# Patient Record
Sex: Female | Born: 1950
Health system: Southern US, Community
[De-identification: ages and names within clinical notes are randomized; demographics above are authoritative.]

## PROBLEM LIST (undated history)

## (undated) DIAGNOSIS — I10 Essential (primary) hypertension: Secondary | ICD-10-CM

## (undated) HISTORY — DX: Essential (primary) hypertension: I10

---

## 2012-07-14 ENCOUNTER — Ambulatory Visit: Payer: Self-pay | Admitting: Family Medicine

## 2012-09-29 ENCOUNTER — Encounter: Payer: Self-pay | Admitting: Family Medicine

## 2012-09-29 ENCOUNTER — Ambulatory Visit (INDEPENDENT_AMBULATORY_CARE_PROVIDER_SITE_OTHER): Payer: BC Managed Care – PPO | Admitting: Family Medicine

## 2012-09-29 VITALS — BP 134/86 | HR 73 | Temp 96.9°F | Ht 63.5 in | Wt 121.2 lb

## 2012-09-29 DIAGNOSIS — J209 Acute bronchitis, unspecified: Secondary | ICD-10-CM

## 2012-09-29 MED ORDER — HYDROCODONE-HOMATROPINE 5-1.5 MG/5ML PO SYRP: 5.0000 mL | ORAL_SOLUTION | Freq: Three times a day (TID) | ORAL | Status: DC | PRN

## 2012-09-29 MED ORDER — AZITHROMYCIN 250 MG PO TABS: ORAL_TABLET | ORAL | Status: DC

## 2012-09-29 MED ORDER — METHYLPREDNISOLONE (PAK) 4 MG PO TABS: ORAL_TABLET | ORAL | Status: DC

## 2012-09-29 NOTE — Patient Instructions (Signed)

## 2012-09-29 NOTE — Progress Notes (Signed)
  Subjective:    Patient ID: Gina Mendoza, female    DOB: September 10, 1950, 62 y.o.   MRN: [id number]  HPI This 62 y.o. female presents for evaluation of cough and bronchitis sx's And she is a smoker.   Review of Systems C/o cough and congestion. No chest pain, SOB, HA, dizziness, vision change, N/V, diarrhea, constipation, dysuria, urinary urgency or frequency, myalgias, arthralgias or rash.     Objective:   Physical Exam Vital signs noted  Well developed well nourished female.  HEENT - Head atraumatic Normocephalic                Eyes - PERRLA, Conjuctiva - clear Sclera- Clear EOMI                Ears - EAC's Wnl TM's Wnl Gross Hearing WNL                Nose - Nares patent                 Throat - oropharanx wnl Respiratory - Lungs with exp wheezes bilateral Cardiac - RRR S1 and S2 without murmur        Assessment & Plan:  Acute bronchitis - Plan: methylPREDNIsolone (MEDROL DOSPACK) 4 MG tablet, azithromycin (ZITHROMAX) 250 MG tablet, HYDROcodone-homatropine (HYCODAN) 5-1.5 MG/5ML syrup Push po fluids, rest, and follow up prn, discussed quitting smoking.  Deatra Canter FNP

## 2012-11-07 ENCOUNTER — Ambulatory Visit (INDEPENDENT_AMBULATORY_CARE_PROVIDER_SITE_OTHER): Payer: BC Managed Care – PPO | Admitting: General Practice

## 2012-11-07 ENCOUNTER — Ambulatory Visit (INDEPENDENT_AMBULATORY_CARE_PROVIDER_SITE_OTHER): Payer: BC Managed Care – PPO

## 2012-11-07 ENCOUNTER — Encounter: Payer: Self-pay | Admitting: General Practice

## 2012-11-07 VITALS — BP 176/97 | HR 75 | Temp 96.5°F | Ht 63.5 in | Wt 119.0 lb

## 2012-11-07 DIAGNOSIS — I10 Essential (primary) hypertension: Secondary | ICD-10-CM

## 2012-11-07 DIAGNOSIS — Z833 Family history of diabetes mellitus: Secondary | ICD-10-CM

## 2012-11-07 DIAGNOSIS — R062 Wheezing: Secondary | ICD-10-CM

## 2012-11-07 DIAGNOSIS — Z0000 Encounter for general adult medical examination without abnormal findings: Secondary | ICD-10-CM

## 2012-11-07 DIAGNOSIS — R05 Cough: Secondary | ICD-10-CM

## 2012-11-07 DIAGNOSIS — R059 Cough, unspecified: Secondary | ICD-10-CM

## 2012-11-07 LAB — POCT CBC
HCT, POC: 39.2 % (ref 37.7–47.9)
MCHC: 34.4 g/dL (ref 31.8–35.4)
MCV: 91.8 fL (ref 80–97)
MPV: 7.4 fL (ref 0–99.8)
POC LYMPH PERCENT: 17.3 %L (ref 10–50)
RBC: 4.3 M/uL (ref 4.04–5.48)
RDW, POC: 13.8 %
WBC: 10.4 10*3/uL — AB (ref 4.6–10.2)

## 2012-11-07 MED ORDER — BENZONATATE 100 MG PO CAPS: 100.0000 mg | ORAL_CAPSULE | Freq: Two times a day (BID) | ORAL | Status: DC | PRN

## 2012-11-07 MED ORDER — ALBUTEROL SULFATE HFA 108 (90 BASE) MCG/ACT IN AERS: 2.0000 | INHALATION_SPRAY | Freq: Four times a day (QID) | RESPIRATORY_TRACT | Status: AC | PRN

## 2012-11-07 MED ORDER — HYDROCHLOROTHIAZIDE 25 MG PO TABS: 25.0000 mg | ORAL_TABLET | Freq: Every day | ORAL | Status: DC

## 2012-11-07 NOTE — Progress Notes (Signed)
  Subjective:    Patient ID: Gina Mendoza, female    DOB: 05-28-1950, 62 y.o.   MRN: [id number]  HPI Patient presents today for annual exam. She reports being seen in September for bronchitis and still has a dry cough that is worse at night. She denies post nasal drainage. Denies being seen by a provider in over ten years outside of visit here in September. Reports smoking 1ppd of cigarettes.  Reports checking blood pressures at home daily, ranged 140-160's/80-94. She reports beginning to eat a healthier diet. Reports checking blood sugar two weeks and received a reading of 200. Reports a family history of diabetes.     Review of Systems  Constitutional: Negative for fever and chills.  HENT: Negative for congestion, postnasal drip, sinus pressure, sneezing and sore throat.   Respiratory: Positive for wheezing. Negative for chest tightness and shortness of breath.   Cardiovascular: Negative for chest pain and palpitations.  Gastrointestinal: Negative for nausea, vomiting, abdominal pain, diarrhea, constipation and blood in stool.  Genitourinary: Negative for dysuria, hematuria and difficulty urinating.  Musculoskeletal: Negative for back pain.  Neurological: Negative for dizziness, weakness and headaches.       Objective:   Physical Exam  Constitutional: She is oriented to person, place, and time. She appears well-developed and well-nourished.  HENT:  Head: Normocephalic and atraumatic.  Right Ear: External ear normal.  Left Ear: External ear normal.  Mouth/Throat: Oropharynx is clear and moist.  Eyes: EOM are normal. Pupils are equal, round, and reactive to light.  Neck: Normal range of motion. Neck supple. No thyromegaly present.  Cardiovascular: Normal rate, regular rhythm and normal heart sounds.   Pulmonary/Chest: Effort normal. No respiratory distress. She has wheezes in the right upper field and the left upper field. She exhibits no tenderness.  Abdominal: Soft. Bowel sounds  are normal. She exhibits no distension. There is no tenderness.  Lymphadenopathy:    She has no cervical adenopathy.  Neurological: She is alert and oriented to person, place, and time.  Skin: Skin is warm and dry.  Psychiatric: She has a normal mood and affect.   [facility] reading (PRIMARY) by Coralie Keens, FNP-C, no acute process.                                        Assessment & Plan:  1. Cough  - DG Chest 2 View; Future -tessalon 100mg , 1 capsule by mouth twice daily for cough, # 20, no refills  2. Annual physical exam  - POCT CBC - CMP14+EGFR - NMR, lipoprofile  3. Family history of diabetes mellitus  - POCT glycosylated hemoglobin (Hb A1C) - POCT glucose (manual entry)  4. Wheezing  - albuterol (PROVENTIL HFA;VENTOLIN HFA) 108 (90 BASE) MCG/ACT inhaler; Inhale 2 puffs into the lungs every 6 (six) hours as needed for wheezing or shortness of breath.  Dispense: 1 Inhaler; Refill: 3  5. Hypertension - hydrochlorothiazide (HYDRODIURIL) 25 MG tablet; Take 1 tablet (25 mg total) by mouth daily.  Dispense: 30 tablet; Refill: 0 -discussed healthy lifestyle changes ( low sodium, low fat, baked foods, regular exercise) -RTO in one week for follow up and sooner if symptoms worsen Patient verbalized understanding Coralie Keens, FNP-C

## 2012-11-07 NOTE — Patient Instructions (Signed)
Smoking Cessation Quitting smoking is important to your health and has many advantages. However, it is not always easy to quit since nicotine is a very addictive drug. Often times, people try 3 times or more before being able to quit. This document explains the best ways for you to prepare to quit smoking. Quitting takes hard work and a lot of effort, but you can do it. ADVANTAGES OF QUITTING SMOKING  You will live longer, feel better, and live better.  Your body will feel the impact of quitting smoking almost immediately.  Within 20 minutes, blood pressure decreases. Your pulse returns to its normal level.  After 8 hours, carbon monoxide levels in the blood return to normal. Your oxygen level increases.  After 24 hours, the chance of having a heart attack starts to decrease. Your breath, hair, and body stop smelling like smoke.  After 48 hours, damaged nerve endings begin to recover. Your sense of taste and smell improve.  After 72 hours, the body is virtually free of nicotine. Your bronchial tubes relax and breathing becomes easier.  After 2 to 12 weeks, lungs can hold more air. Exercise becomes easier and circulation improves.  The risk of having a heart attack, stroke, cancer, or lung disease is greatly reduced.  After 1 year, the risk of coronary heart disease is cut in half.  After 5 years, the risk of stroke falls to the same as a nonsmoker.  After 10 years, the risk of lung cancer is cut in half and the risk of other cancers decreases significantly.  After 15 years, the risk of coronary heart disease drops, usually to the level of a nonsmoker.  If you are pregnant, quitting smoking will improve your chances of having a healthy baby.  The people you live with, especially any children, will be healthier.  You will have extra money to spend on things other than cigarettes. QUESTIONS TO THINK ABOUT BEFORE ATTEMPTING TO QUIT You may want to talk about your answers with your  caregiver.  Why do you want to quit?  If you tried to quit in the past, what helped and what did not?  What will be the most difficult situations for you after you quit? How will you plan to handle them?  Who can help you through the tough times? Your family? Friends? A caregiver?  What pleasures do you get from smoking? What ways can you still get pleasure if you quit? Here are some questions to ask your caregiver:  How can you help me to be successful at quitting?  What medicine do you think would be best for me and how should I take it?  What should I do if I need more help?  What is smoking withdrawal like? How can I get information on withdrawal? GET READY  Set a quit date.  Change your environment by getting rid of all cigarettes, ashtrays, matches, and lighters in your home, car, or work. Do not let people smoke in your home.  Review your past attempts to quit. Think about what worked and what did not. GET SUPPORT AND ENCOURAGEMENT You have a better chance of being successful if you have help. You can get support in many ways.  Tell your family, friends, and co-workers that you are going to quit and need their support. Ask them not to smoke around you.  Get individual, group, or telephone counseling and support. Programs are available at local hospitals and health centers. Call your local health department for   information about programs in your area.  Spiritual beliefs and practices may help some smokers quit.  Download a "quit meter" on your computer to keep track of quit statistics, such as how long you have gone without smoking, cigarettes not smoked, and money saved.  Get a self-help book about quitting smoking and staying off of tobacco. LEARN NEW SKILLS AND BEHAVIORS  Distract yourself from urges to smoke. Talk to someone, go for a walk, or occupy your time with a task.  Change your normal routine. Take a different route to work. Drink tea instead of coffee.  Eat breakfast in a different place.  Reduce your stress. Take a hot bath, exercise, or read a book.  Plan something enjoyable to do every day. Reward yourself for not smoking.  Explore interactive web-based programs that specialize in helping you quit. GET MEDICINE AND USE IT CORRECTLY Medicines can help you stop smoking and decrease the urge to smoke. Combining medicine with the above behavioral methods and support can greatly increase your chances of successfully quitting smoking.  Nicotine replacement therapy helps deliver nicotine to your body without the negative effects and risks of smoking. Nicotine replacement therapy includes nicotine gum, lozenges, inhalers, nasal sprays, and skin patches. Some may be available over-the-counter and others require a prescription.  Antidepressant medicine helps people abstain from smoking, but how this works is unknown. This medicine is available by prescription.  Nicotinic receptor partial agonist medicine simulates the effect of nicotine in your brain. This medicine is available by prescription. Ask your caregiver for advice about which medicines to use and how to use them based on your health history. Your caregiver will tell you what side effects to look out for if you choose to be on a medicine or therapy. Carefully read the information on the package. Do not use any other product containing nicotine while using a nicotine replacement product.  RELAPSE OR DIFFICULT SITUATIONS Most relapses occur within the first 3 months after quitting. Do not be discouraged if you start smoking again. Remember, most people try several times before finally quitting. You may have symptoms of withdrawal because your body is used to nicotine. You may crave cigarettes, be irritable, feel very hungry, cough often, get headaches, or have difficulty concentrating. The withdrawal symptoms are only temporary. They are strongest when you first quit, but they will go away within  10 14 days. To reduce the chances of relapse, try to:  Avoid drinking alcohol. Drinking lowers your chances of successfully quitting.  Reduce the amount of caffeine you consume. Once you quit smoking, the amount of caffeine in your body increases and can give you symptoms, such as a rapid heartbeat, sweating, and anxiety.  Avoid smokers because they can make you want to smoke.  Do not let weight gain distract you. Many smokers will gain weight when they quit, usually less than 10 pounds. Eat a healthy diet and stay active. You can always lose the weight gained after you quit.  Find ways to improve your mood other than smoking. FOR MORE INFORMATION  www.smokefree.gov  Document Released: 12/15/2000 Document Revised: 06/22/2011 Document Reviewed: 04/01/2011 [employer] Patient Information 2014 [employer].  

## 2012-11-09 ENCOUNTER — Other Ambulatory Visit: Payer: Self-pay | Admitting: General Practice

## 2012-11-09 LAB — CMP14+EGFR
ALT: 9 IU/L (ref 0–32)
AST: 17 IU/L (ref 0–40)
Albumin/Globulin Ratio: 1.9 (ref 1.1–2.5)
Alkaline Phosphatase: 97 IU/L (ref 39–117)
GFR calc Af Amer: 99 mL/min/{1.73_m2} (ref >59)
GFR calc non Af Amer: 86 mL/min/{1.73_m2} (ref >59)
Potassium: 4.2 mmol/L (ref 3.5–5.2)
Sodium: 143 mmol/L (ref 134–144)
Total Bilirubin: <0.2 mg/dL (ref 0.0–1.2)
Total Protein: 6.7 g/dL (ref 6.0–8.5)

## 2012-11-09 LAB — NMR, LIPOPROFILE
HDL Particle Number: 32.1 umol/L (ref >=30.5)
LDL Particle Number: 2155 nmol/L — ABNORMAL HIGH (ref <1000)
LDL Size: 20.7 nm (ref >20.5)
LP-IR Score: 47 — ABNORMAL HIGH (ref <=45)
Small LDL Particle Number: 1065 nmol/L — ABNORMAL HIGH (ref <=527)
Triglycerides by NMR: 162 mg/dL — ABNORMAL HIGH (ref <150)

## 2012-11-14 ENCOUNTER — Encounter: Payer: Self-pay | Admitting: General Practice

## 2012-11-14 ENCOUNTER — Ambulatory Visit (INDEPENDENT_AMBULATORY_CARE_PROVIDER_SITE_OTHER): Payer: BC Managed Care – PPO | Admitting: General Practice

## 2012-11-14 ENCOUNTER — Telehealth: Payer: Self-pay

## 2012-11-14 ENCOUNTER — Encounter (INDEPENDENT_AMBULATORY_CARE_PROVIDER_SITE_OTHER): Payer: Self-pay

## 2012-11-14 VITALS — BP 124/82 | HR 79 | Temp 97.4°F | Wt 116.0 lb

## 2012-11-14 DIAGNOSIS — R059 Cough, unspecified: Secondary | ICD-10-CM

## 2012-11-14 DIAGNOSIS — R05 Cough: Secondary | ICD-10-CM

## 2012-11-14 DIAGNOSIS — I10 Essential (primary) hypertension: Secondary | ICD-10-CM

## 2012-11-14 DIAGNOSIS — E785 Hyperlipidemia, unspecified: Secondary | ICD-10-CM

## 2012-11-14 MED ORDER — ATORVASTATIN CALCIUM 20 MG PO TABS: 20.0000 mg | ORAL_TABLET | Freq: Every day | ORAL | Status: AC

## 2012-11-14 MED ORDER — BENZONATATE 100 MG PO CAPS: 100.0000 mg | ORAL_CAPSULE | Freq: Two times a day (BID) | ORAL | Status: AC | PRN

## 2012-11-14 MED ORDER — HYDROCHLOROTHIAZIDE 25 MG PO TABS: 25.0000 mg | ORAL_TABLET | Freq: Every day | ORAL | Status: DC

## 2012-11-14 NOTE — Patient Instructions (Addendum)
Hypertriglyceridemia  Diet for High blood levels of Triglycerides Most fats in food are triglycerides. Triglycerides in your blood are stored as fat in your body. High levels of triglycerides in your blood may put you at a greater risk for heart disease and stroke.  Normal triglyceride levels are less than 150 mg/dL. Borderline high levels are 150-199 mg/dl. High levels are 200 - 499 mg/dL, and very high triglyceride levels are greater than 500 mg/dL. The decision to treat high triglycerides is generally based on the level. For people with borderline or high triglyceride levels, treatment includes weight loss and exercise. Drugs are recommended for people with very high triglyceride levels. Many people who need treatment for high triglyceride levels have metabolic syndrome. This syndrome is a collection of disorders that often include: insulin resistance, high blood pressure, blood clotting problems, high cholesterol and triglycerides. TESTING PROCEDURE FOR TRIGLYCERIDES  You should not eat 4 hours before getting your triglycerides measured. The normal range of triglycerides is between 10 and 250 milligrams per deciliter (mg/dl). Some people may have extreme levels (1000 or above), but your triglyceride level may be too high if it is above 150 mg/dl, depending on what other risk factors you have for heart disease.  People with high blood triglycerides may also have high blood cholesterol levels. If you have high blood cholesterol as well as high blood triglycerides, your risk for heart disease is probably greater than if you only had high triglycerides. High blood cholesterol is one of the main risk factors for heart disease. CHANGING YOUR DIET  Your weight can affect your blood triglyceride level. If you are more than 20% above your ideal body weight, you may be able to lower your blood triglycerides by losing weight. Eating less and exercising regularly is the best way to combat this. Fat provides more  calories than any other food. The best way to lose weight is to eat less fat. Only 30% of your total calories should come from fat. Less than 7% of your diet should come from saturated fat. A diet low in fat and saturated fat is the same as a diet to decrease blood cholesterol. By eating a diet lower in fat, you may lose weight, lower your blood cholesterol, and lower your blood triglyceride level.  Eating a diet low in fat, especially saturated fat, may also help you lower your blood triglyceride level. Ask your dietitian to help you figure how much fat you can eat based on the number of calories your caregiver has prescribed for you.  Exercise, in addition to helping with weight loss may also help lower triglyceride levels.   Alcohol can increase blood triglycerides. You may need to stop drinking alcoholic beverages.  Too much carbohydrate in your diet may also increase your blood triglycerides. Some complex carbohydrates are necessary in your diet. These may include bread, rice, potatoes, other starchy vegetables and cereals.  Reduce "simple" carbohydrates. These may include pure sugars, candy, honey, and jelly without losing other nutrients. If you have the kind of high blood triglycerides that is affected by the amount of carbohydrates in your diet, you will need to eat less sugar and less high-sugar foods. Your caregiver can help you with this.  Adding 2-4 grams of fish oil (EPA+ DHA) may also help lower triglycerides. Speak with your caregiver before adding any supplements to your regimen. Following the Diet  Maintain your ideal weight. Your caregivers can help you with a diet. Generally, eating less food and getting more   exercise will help you lose weight. Joining a weight control group may also help. Ask your caregivers for a good weight control group in your area.  Eat low-fat foods instead of high-fat foods. This can help you lose weight too.  These foods are lower in fat. Eat MORE of these:    Dried beans, peas, and lentils.  Egg whites.  Low-fat cottage cheese.  Fish.  Lean cuts of meat, such as round, sirloin, rump, and flank (cut extra fat off meat you fix).  Whole grain breads, cereals and pasta.  Skim and nonfat dry milk.  Low-fat yogurt.  Poultry without the skin.  Cheese made with skim or part-skim milk, such as mozzarella, parmesan, farmers', ricotta, or pot cheese. These are higher fat foods. Eat LESS of these:   Whole milk and foods made from whole milk, such as American, blue, cheddar, monterey jack, and swiss cheese  High-fat meats, such as luncheon meats, sausages, knockwurst, bratwurst, hot dogs, ribs, corned beef, ground pork, and regular ground beef.  Fried foods. Limit saturated fats in your diet. Substituting unsaturated fat for saturated fat may decrease your blood triglyceride level. You will need to read package labels to know which products contain saturated fats.  These foods are high in saturated fat. Eat LESS of these:   Fried pork skins.  Whole milk.  Skin and fat from poultry.  Palm oil.  Butter.  Shortening.  Cream cheese.  Bacon.  Margarines and baked goods made from listed oils.  Vegetable shortenings.  Chitterlings.  Fat from meats.  Coconut oil.  Palm kernel oil.  Lard.  Cream.  Sour cream.  Fatback.  Coffee whiteners and non-dairy creamers made with these oils.  Cheese made from whole milk. Use unsaturated fats (both polyunsaturated and monounsaturated) moderately. Remember, even though unsaturated fats are better than saturated fats; you still want a diet low in total fat.  These foods are high in unsaturated fat:   Canola oil.  Sunflower oil.  Mayonnaise.  Almonds.  Peanuts.  Pine nuts.  Margarines made with these oils.  Safflower oil.  Olive oil.  Avocados.  Cashews.  Peanut butter.  Sunflower seeds.  Soybean oil.  Peanut  oil.  Olives.  Pecans.  Walnuts.  Pumpkin seeds. Avoid sugar and other high-sugar foods. This will decrease carbohydrates without decreasing other nutrients. Sugar in your food goes rapidly to your blood. When there is excess sugar in your blood, your liver may use it to make more triglycerides. Sugar also contains calories without other important nutrients.  Eat LESS of these:   Sugar, brown sugar, powdered sugar, jam, jelly, preserves, honey, syrup, molasses, pies, candy, cakes, cookies, frosting, pastries, colas, soft drinks, punches, fruit drinks, and regular gelatin.  Avoid alcohol. Alcohol, even more than sugar, may increase blood triglycerides. In addition, alcohol is high in calories and low in nutrients. Ask for sparkling water, or a diet soft drink instead of an alcoholic beverage. Suggestions for planning and preparing meals   Bake, broil, grill or roast meats instead of frying.  Remove fat from meats and skin from poultry before cooking.  Add spices, herbs, lemon juice or vinegar to vegetables instead of salt, rich sauces or gravies.  Use a non-stick skillet without fat or use no-stick sprays.  Cool and refrigerate stews and broth. Then remove the hardened fat floating on the surface before serving.  Refrigerate meat drippings and skim off fat to make low-fat gravies.  Serve more fish.  Use less butter,   margarine and other high-fat spreads on bread or vegetables.  Use skim or reconstituted non-fat dry milk for cooking.  Cook with low-fat cheeses.  Substitute low-fat yogurt or cottage cheese for all or part of the sour cream in recipes for sauces, dips or congealed salads.  Use half yogurt/half mayonnaise in salad recipes.  Substitute evaporated skim milk for cream. Evaporated skim milk or reconstituted non-fat dry milk can be whipped and substituted for whipped cream in certain recipes.  Choose fresh fruits for dessert instead of high-fat foods such as pies or  cakes. Fruits are naturally low in fat. When Dining Out   Order low-fat appetizers such as fruit or vegetable juice, pasta with vegetables or tomato sauce.  Select clear, rather than cream soups.  Ask that dressings and gravies be served on the side. Then use less of them.  Order foods that are baked, broiled, poached, steamed, stir-fried, or roasted.  Ask for margarine instead of butter, and use only a small amount.  Drink sparkling water, unsweetened tea or coffee, or diet soft drinks instead of alcohol or other sweet beverages. QUESTIONS AND ANSWERS ABOUT OTHER FATS IN THE BLOOD: SATURATED FAT, TRANS FAT, AND CHOLESTEROL What is trans fat? Trans fat is a type of fat that is formed when vegetable oil is hardened through a process called hydrogenation. This process helps makes foods more solid, gives them shape, and prolongs their shelf life. Trans fats are also called hydrogenated or partially hydrogenated oils.  What do saturated fat, trans fat, and cholesterol in foods have to do with heart disease? Saturated fat, trans fat, and cholesterol in the diet all raise the level of LDL "bad" cholesterol in the blood. The higher the LDL cholesterol, the greater the risk for coronary heart disease (CHD). Saturated fat and trans fat raise LDL similarly.  What foods contain saturated fat, trans fat, and cholesterol? High amounts of saturated fat are found in animal products, such as fatty cuts of meat, chicken skin, and full-fat dairy products like butter, whole milk, cream, and cheese, and in tropical vegetable oils such as palm, palm kernel, and coconut oil. Trans fat is found in some of the same foods as saturated fat, such as vegetable shortening, some margarines (especially hard or stick margarine), crackers, cookies, baked goods, fried foods, salad dressings, and other processed foods made with partially hydrogenated vegetable oils. Small amounts of trans fat also occur naturally in some animal  products, such as milk products, beef, and lamb. Foods high in cholesterol include liver, other organ meats, egg yolks, shrimp, and full-fat dairy products. How can I use the new food label to make heart-healthy food choices? Check the Nutrition Facts panel of the food label. Choose foods lower in saturated fat, trans fat, and cholesterol. For saturated fat and cholesterol, you can also use the Percent Daily Value (%DV): 5% DV or less is low, and 20% DV or more is high. (There is no %DV for trans fat.) Use the Nutrition Facts panel to choose foods low in saturated fat and cholesterol, and if the trans fat is not listed, read the ingredients and limit products that list shortening or hydrogenated or partially hydrogenated vegetable oil, which tend to be high in trans fat. POINTS TO REMEMBER:   Discuss your risk for heart disease with your caregivers, and take steps to reduce risk factors.  Change your diet. Choose foods that are low in saturated fat, trans fat, and cholesterol.  Add exercise to your daily routine if   it is not already being done. Participate in physical activity of moderate intensity, like brisk walking, for at least 30 minutes on most, and preferably all days of the week. No time? Break the 30 minutes into three, 10-minute segments during the day.  Stop smoking. If you do smoke, contact your caregiver to discuss ways in which they can help you quit.  Do not use street drugs.  Maintain a normal weight.  Maintain a healthy blood pressure.  Keep up with your blood work for checking the fats in your blood as directed by your caregiver. Document Released: 10/09/2003 Document Revised: 06/22/2011 Document Reviewed: 05/06/2008 [employer] Patient Information 2014 [employer].  

## 2012-11-14 NOTE — Telephone Encounter (Signed)
Patient says you were suppose to  Order colonscopy

## 2012-11-15 NOTE — Progress Notes (Signed)
  Subjective:    Patient ID: Gina Mendoza, female    DOB: [birth year], 62 y.o.   MRN: [id number]  HPI Patient presents for follow up of hypertension. She reports checking blood pressure outside of this office and readings range 120-130's/80's. She reports taking medications as directed and eating healthier. Denies regular exercise. Discussed labs and hyperlipidemia.  Patient reports having a cough, but tessalon wasn't called into pharmacy.    Review of Systems  Respiratory: Negative for chest tightness and shortness of breath.   Cardiovascular: Negative for chest pain and palpitations.       Objective:   Physical Exam  Constitutional: She is oriented to person, place, and time. She appears well-developed and well-nourished.  Cardiovascular: Normal rate, regular rhythm and normal heart sounds.   Pulmonary/Chest: Effort normal and breath sounds normal. No respiratory distress. She exhibits no tenderness.  Neurological: She is alert and oriented to person, place, and time.  Skin: Skin is warm and dry.  Psychiatric: She has a normal mood and affect.          Assessment & Plan:  1. Cough  - benzonatate (TESSALON) 100 MG capsule; Take 1 capsule (100 mg total) by mouth 2 (two) times daily as needed for cough.  Dispense: 20 capsule; Refill: 0  2. Hypertension  - hydrochlorothiazide (HYDRODIURIL) 25 MG tablet; Take 1 tablet (25 mg total) by mouth daily.  Dispense: 30 tablet; Refill: 3  3. Hyperlipidemia  - atorvastatin (LIPITOR) 20 MG tablet; Take 1 tablet (20 mg total) by mouth daily.  Dispense: 30 tablet; Refill: 3 -discussed healthy eating (low fat, baked foods) -RTO in 3 months for follow up -Patient verbalized understanding Coralie Keens, FNP-C

## 2012-11-15 NOTE — Telephone Encounter (Signed)
Was areferral done for colonoscopy?

## 2012-11-15 NOTE — Telephone Encounter (Signed)
Referral made, she will be contacted with appointment time

## 2012-11-16 ENCOUNTER — Telehealth: Payer: Self-pay | Admitting: General Practice

## 2012-11-20 ENCOUNTER — Encounter: Payer: Self-pay | Admitting: Internal Medicine

## 2012-12-04 ENCOUNTER — Ambulatory Visit (AMBULATORY_SURGERY_CENTER): Payer: Self-pay | Admitting: *Deleted

## 2012-12-04 VITALS — Ht 64.0 in | Wt 117.4 lb

## 2012-12-04 DIAGNOSIS — Z1211 Encounter for screening for malignant neoplasm of colon: Secondary | ICD-10-CM

## 2012-12-04 MED ORDER — MOVIPREP 100 G PO SOLR: 1.0000 | Freq: Once | ORAL | Status: DC

## 2012-12-04 NOTE — Progress Notes (Signed)
Denies allergies to eggs or soy products. Denies complications with sedation or anesthesia. 

## 2012-12-05 ENCOUNTER — Encounter: Payer: Self-pay | Admitting: Internal Medicine

## 2012-12-14 ENCOUNTER — Encounter: Payer: Self-pay | Admitting: Internal Medicine

## 2012-12-14 ENCOUNTER — Ambulatory Visit (AMBULATORY_SURGERY_CENTER): Payer: BC Managed Care – PPO | Admitting: Internal Medicine

## 2012-12-14 VITALS — BP 120/74 | HR 67 | Temp 98.1°F | Resp 17 | Ht 64.0 in | Wt 117.0 lb

## 2012-12-14 DIAGNOSIS — Z1211 Encounter for screening for malignant neoplasm of colon: Secondary | ICD-10-CM

## 2012-12-14 DIAGNOSIS — Z800 Family history of malignant neoplasm of digestive organs: Secondary | ICD-10-CM

## 2012-12-14 MED ORDER — SODIUM CHLORIDE 0.9 % IV SOLN: 500.0000 mL | INTRAVENOUS | Status: DC

## 2012-12-14 NOTE — Patient Instructions (Signed)
Findings:  Diverticulosis Recommendations:  Repeat colonoscopy in 10 years  YOU HAD AN ENDOSCOPIC PROCEDURE TODAY AT THE [facility]: Refer to the procedure report that was given to you for any specific questions about what was found during the examination.  If the procedure report does not answer your questions, please call your gastroenterologist to clarify.  If you requested that your care partner not be given the details of your procedure findings, then the procedure report has been included in a sealed envelope for you to review at your convenience later.  YOU SHOULD EXPECT: Some feelings of bloating in the abdomen. Passage of more gas than usual.  Walking can help get rid of the air that was put into your GI tract during the procedure and reduce the bloating. If you had a lower endoscopy (such as a colonoscopy or flexible sigmoidoscopy) you may notice spotting of blood in your stool or on the toilet paper. If you underwent a bowel prep for your procedure, then you may not have a normal bowel movement for a few days.  DIET: Your first meal following the procedure should be a light meal and then it is ok to progress to your normal diet.  A half-sandwich or bowl of soup is an example of a good first meal.  Heavy or fried foods are harder to digest and may make you feel nauseous or bloated.  Likewise meals heavy in dairy and vegetables can cause extra gas to form and this can also increase the bloating.  Drink plenty of fluids but you should avoid alcoholic beverages for 24 hours.  ACTIVITY: Your care partner should take you home directly after the procedure.  You should plan to take it easy, moving slowly for the rest of the day.  You can resume normal activity the day after the procedure however you should NOT DRIVE or use heavy machinery for 24 hours (because of the sedation medicines used during the test).    SYMPTOMS TO REPORT IMMEDIATELY: A gastroenterologist can be reached at any  hour.  During normal business hours, 8:30 AM to 5:00 PM Monday through Friday, call [phone].  After hours and on weekends, please call the GI answering service at [phone] who will take a message and have the physician on call contact you.   Following lower endoscopy (colonoscopy or flexible sigmoidoscopy):  Excessive amounts of blood in the stool  Significant tenderness or worsening of abdominal pains  Swelling of the abdomen that is new, acute  Fever of 100F or higher  Following upper endoscopy (EGD)  Vomiting of blood or coffee ground material  New chest pain or pain under the shoulder blades  Painful or persistently difficult swallowing  New shortness of breath  Fever of 100F or higher  Black, tarry-looking stools  FOLLOW UP: If any biopsies were taken you will be contacted by phone or by letter within the next 1-3 weeks.  Call your gastroenterologist if you have not heard about the biopsies in 3 weeks.  Our staff will call the home number listed on your records the next business day following your procedure to check on you and address any questions or concerns that you may have at that time regarding the information given to you following your procedure. This is a courtesy call and so if there is no answer at the home number and we have not heard from you through the emergency physician on call, we will assume that you have returned to your   regular daily activities without incident.  SIGNATURES/CONFIDENTIALITY: You and/or your care partner have signed paperwork which will be entered into your electronic medical record.  These signatures attest to the fact that that the information above on your After Visit Summary has been reviewed and is understood.  Full responsibility of the confidentiality of this discharge information lies with you and/or your care-partner.   Please follow all discharge instructions given to you by the recovery room nurse. If you have any questions or  problems after discharge please call one of the numbers listed above. You will receive a phone call in the am to see how you are doing and answer any questions you may have. Thank you for choosing [facility] for your health care needs.  

## 2012-12-14 NOTE — Op Note (Signed)
[facility] [address] [place], [address]   COLONOSCOPY PROCEDURE REPORT  PATIENT: Gina, Mendoza  MR#: [id number] BIRTHDATE: 1950/01/17 , 62  yrs. old GENDER: Female ENDOSCOPIST: Roxy Cedar, MD REFERRED BY:.  [employer] PROCEDURE DATE:  12/14/2012 PROCEDURE:   Colonoscopy, screening First Screening Colonoscopy - Avg.  risk and is 50 yrs.  old or older Yes.  Prior Negative Screening - Now for repeat screening. N/A  History of Adenoma - Now for follow-up colonoscopy & has been > or = to 3 yrs.  N/A  Polyps Removed Today? No.  Recommend repeat exam, <10 yrs? No. ASA CLASS:   Class II INDICATIONS:Patient's immediate family history of colon cancer. Mom 72 MEDICATIONS: MAC sedation, administered by CRNA and propofol (Diprivan) 250mg  IV  DESCRIPTION OF PROCEDURE:   After the risks benefits and alternatives of the procedure were thoroughly explained, informed consent was obtained.  A digital rectal exam revealed no abnormalities of the rectum.   The LB [id number]  endoscope was introduced through the anus and advanced to the cecum, which was identified by both the appendix and ileocecal valve. No adverse events experienced.   The quality of the prep was excellent, using MoviPrep  The instrument was then slowly withdrawn as the colon was fully examined.   COLON FINDINGS: Severe diverticulosis was noted  in the left colon. The colon was otherwise normal.  There was no  inflammation, polyps or cancers unless previously stated.  Retroflexed views revealed internal hemorrhoids. The time to cecum=2 minutes 45 seconds.  Withdrawal time=9 minutes 11 seconds.  The scope was withdrawn and the procedure completed.  COMPLICATIONS: There were no complications.  ENDOSCOPIC IMPRESSION: 1.   Severe diverticulosis was noted in the left colon 2.   The colon was otherwise normal  RECOMMENDATIONS: 1. Continue current colorectal screening recommendations  for "routine risk" patients with a repeat colonoscopy in 10 years.   eSigned:  Roxy Cedar, MD 12/14/2012 9:20 AM   cc: Rudi Heap, MD and The Patient

## 2012-12-14 NOTE — Progress Notes (Signed)
Patient did not experience any of the following events: a burn prior to discharge; a fall within the facility; wrong site/side/patient/procedure/implant event; or a hospital transfer or hospital admission upon discharge from the facility. (G8907) Patient did not have preoperative order for IV antibiotic SSI prophylaxis. (G8918)  

## 2012-12-14 NOTE — Progress Notes (Signed)
Lidocaine-40mg IV prior to Propofol InductionPropofol given over incremental dosages 

## 2012-12-15 ENCOUNTER — Telehealth: Payer: Self-pay | Admitting: *Deleted

## 2012-12-15 NOTE — Telephone Encounter (Signed)
No answer. No identifier. Message left to call if any questions or concerns. 

## 2013-03-12 ENCOUNTER — Ambulatory Visit: Payer: BC Managed Care – PPO | Admitting: Family Medicine

## 2013-04-09 ENCOUNTER — Other Ambulatory Visit: Payer: Self-pay | Admitting: General Practice

## 2014-08-26 ENCOUNTER — Other Ambulatory Visit: Payer: Self-pay | Admitting: Obstetrics & Gynecology

## 2014-08-26 DIAGNOSIS — Z780 Asymptomatic menopausal state: Secondary | ICD-10-CM

## 2014-09-04 ENCOUNTER — Ambulatory Visit
Admission: RE | Admit: 2014-09-04 | Discharge: 2014-09-04 | Disposition: A | Discharge status: Home / Self Care | Payer: BLUE CROSS/BLUE SHIELD | Source: Ambulatory Visit | Attending: Obstetrics & Gynecology | Admitting: Obstetrics & Gynecology

## 2014-09-04 DIAGNOSIS — Z780 Asymptomatic menopausal state: Secondary | ICD-10-CM

## 2014-10-15 ENCOUNTER — Telehealth: Payer: Self-pay | Admitting: Acute Care

## 2014-10-15 ENCOUNTER — Encounter: Payer: Self-pay | Admitting: Acute Care

## 2014-10-15 NOTE — Telephone Encounter (Signed)
I have left a message with my contact information  requesting that this patient call me to schedule a screening. I will await her return call.

## 2014-11-01 NOTE — Telephone Encounter (Signed)
Left Message to make Appointment.  Sent letter  Dear Gina Mendoza, We have attempted to call you several times to schedule the lung screening [facility] requested you have performed. We have been unable to contact you by phone. Please call the number below at your earliest convenience so that we can get you scheduled for your screening. We look forward to participating in your care.  Thank you,  [facility] [phone]

## 2014-11-02 IMAGING — CR DG CHEST 2V
2 series · 2 of 2 positions shown · non-contrast
Comparison: None.

CLINICAL DATA: Cough

EXAM:
CHEST  2 VIEW

[view not recorded (1 of 2)]
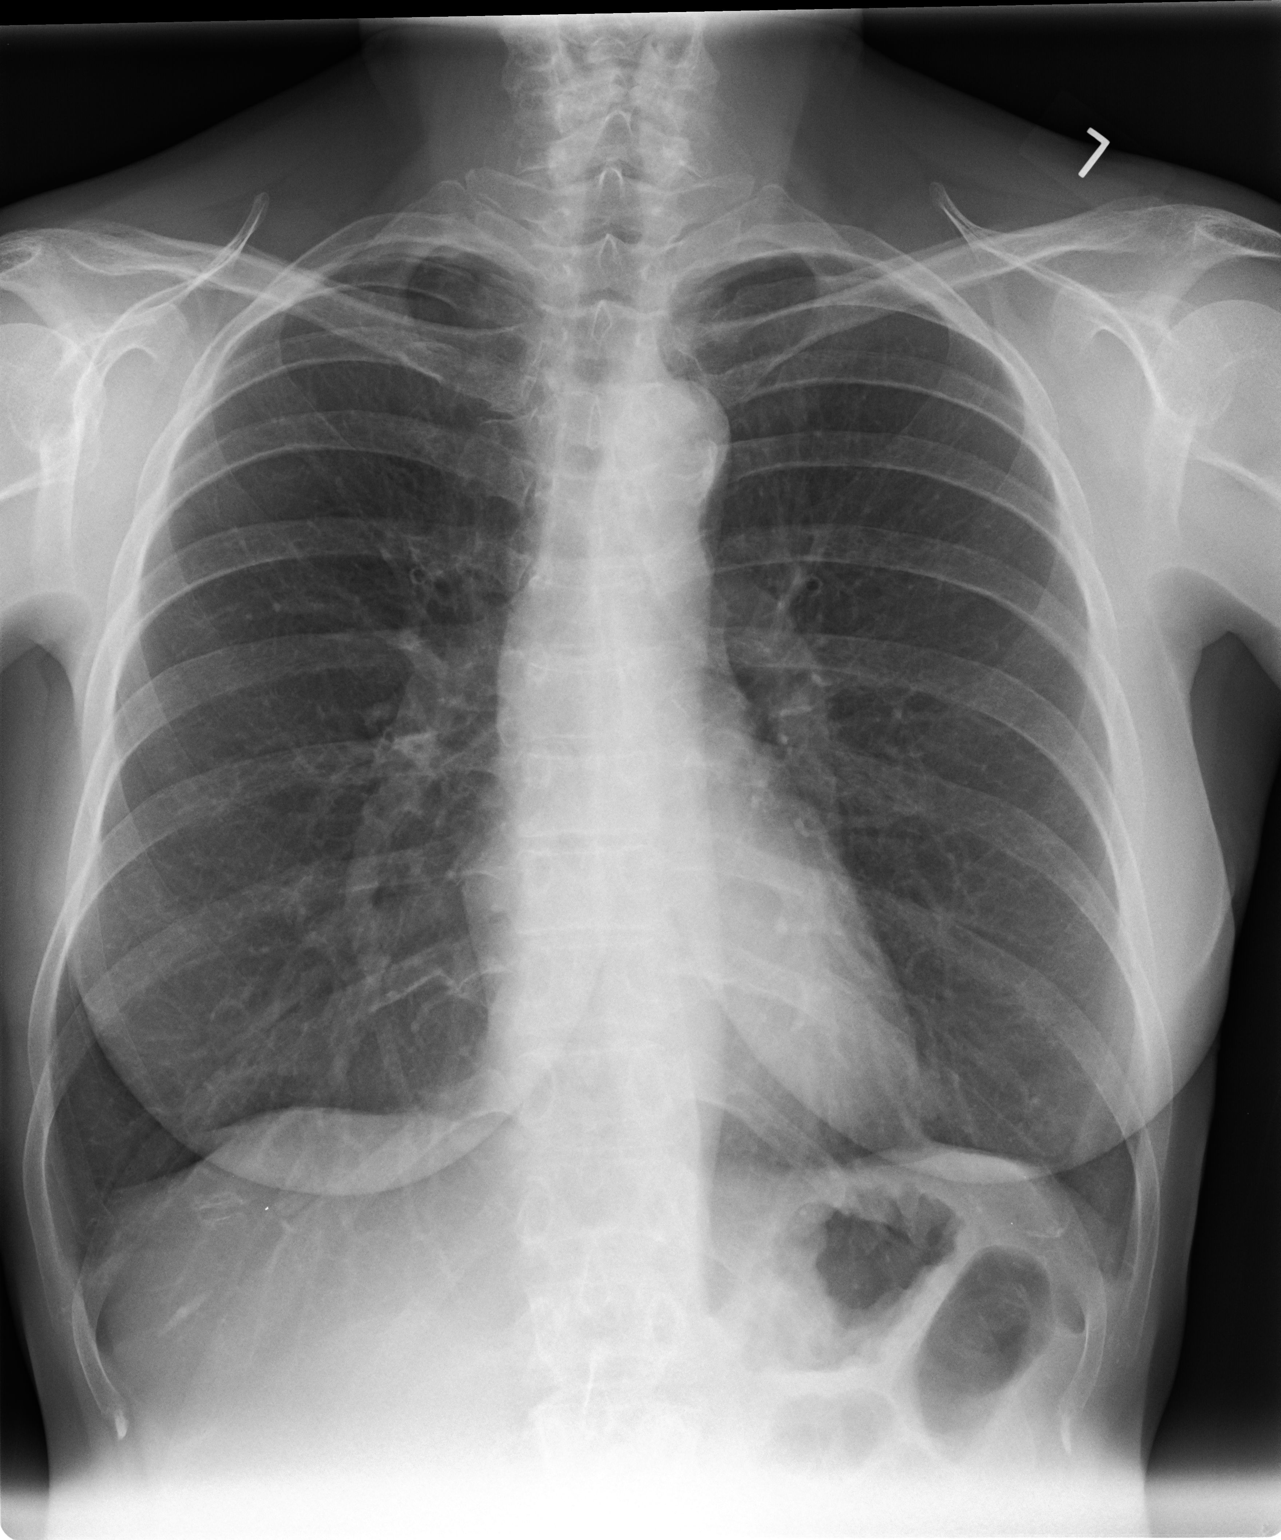

[view not recorded (2 of 2)]
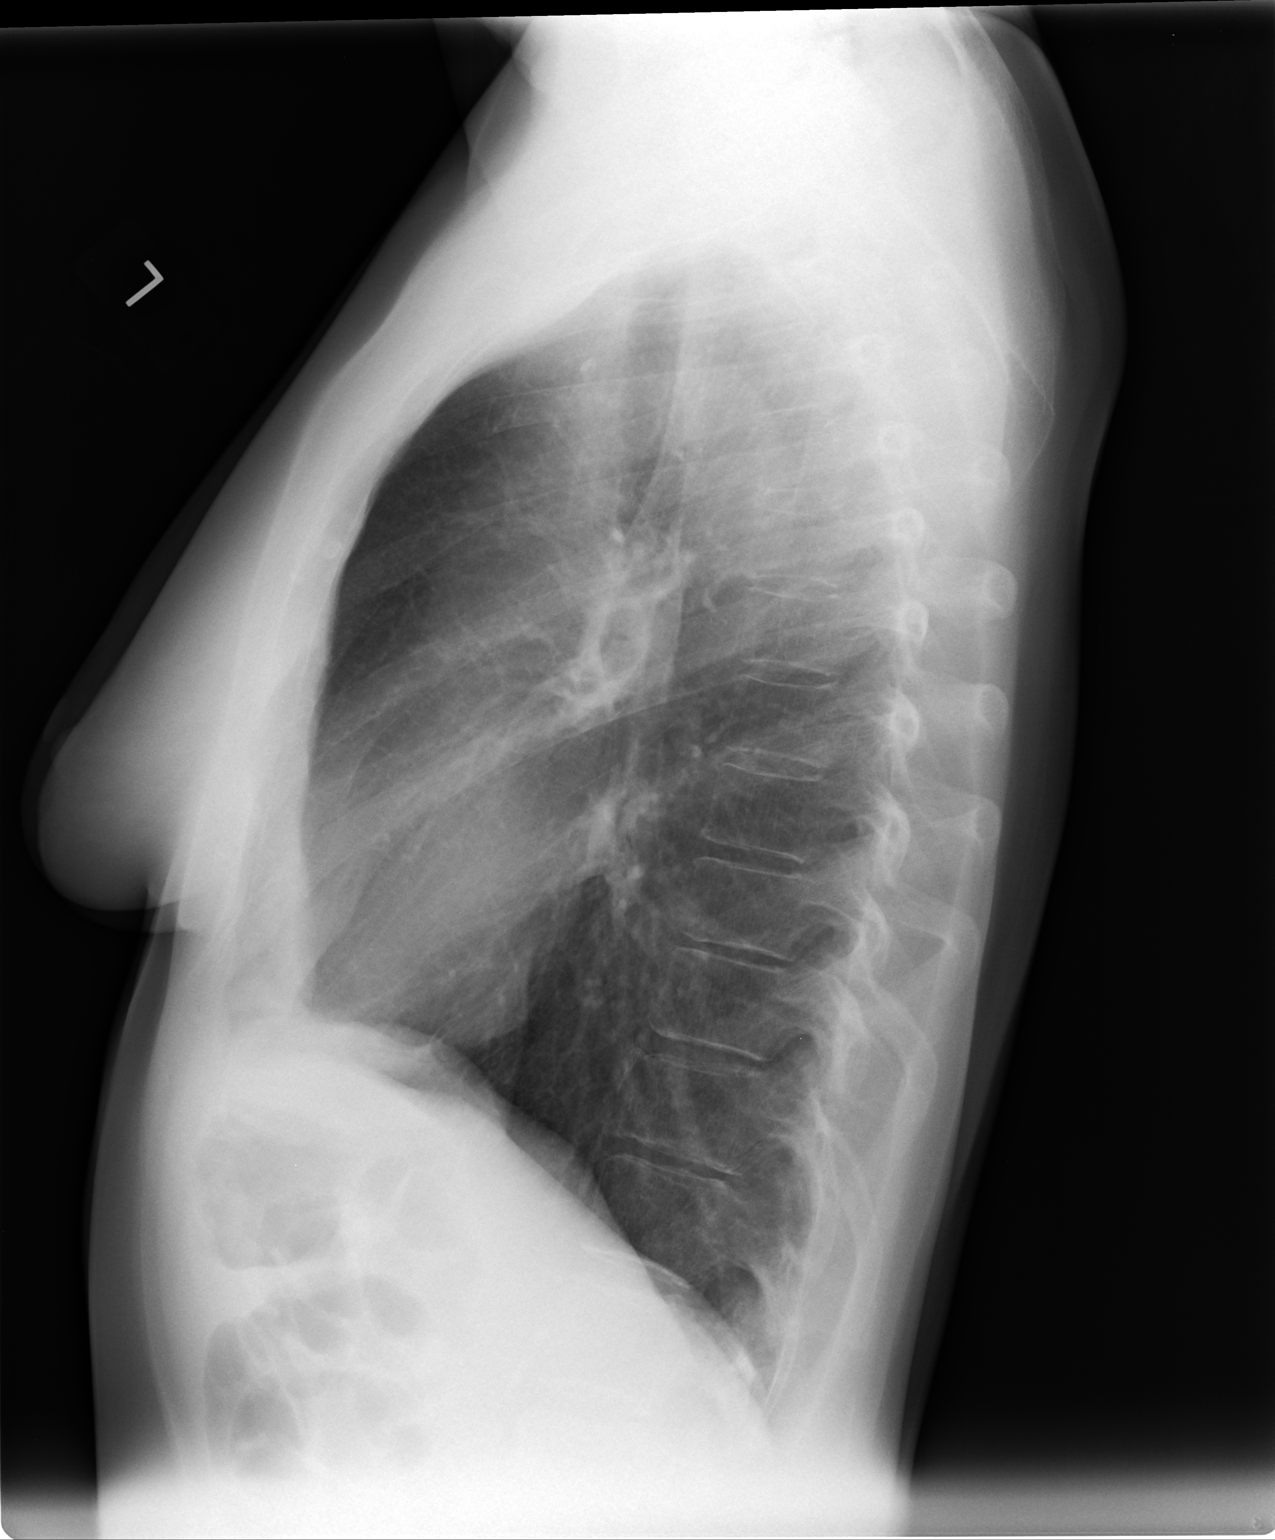

[2 of 2 positions shown; findings below may reference images not displayed]

FINDINGS: Normal cardiac and mediastinal contours. No large consolidative
pulmonary opacity. No pleural effusion or pneumothorax. Regional
skeleton is unremarkable.
IMPRESSION: No acute cardiopulmonary process.

## 2017-06-07 ENCOUNTER — Other Ambulatory Visit: Payer: Self-pay | Admitting: Obstetrics & Gynecology

## 2017-06-07 DIAGNOSIS — E2839 Other primary ovarian failure: Secondary | ICD-10-CM

## 2017-07-22 ENCOUNTER — Other Ambulatory Visit: Payer: BLUE CROSS/BLUE SHIELD

## 2018-04-04 DIAGNOSIS — J4520 Mild intermittent asthma, uncomplicated: Secondary | ICD-10-CM | POA: Diagnosis not present

## 2018-04-04 DIAGNOSIS — I10 Essential (primary) hypertension: Secondary | ICD-10-CM | POA: Diagnosis not present

## 2018-04-04 DIAGNOSIS — F411 Generalized anxiety disorder: Secondary | ICD-10-CM | POA: Diagnosis not present

## 2018-07-04 DIAGNOSIS — Z0000 Encounter for general adult medical examination without abnormal findings: Secondary | ICD-10-CM | POA: Diagnosis not present

## 2018-07-04 DIAGNOSIS — J4520 Mild intermittent asthma, uncomplicated: Secondary | ICD-10-CM | POA: Diagnosis not present

## 2018-07-04 DIAGNOSIS — F411 Generalized anxiety disorder: Secondary | ICD-10-CM | POA: Diagnosis not present

## 2018-07-04 DIAGNOSIS — I10 Essential (primary) hypertension: Secondary | ICD-10-CM | POA: Diagnosis not present

## 2018-10-04 DIAGNOSIS — E782 Mixed hyperlipidemia: Secondary | ICD-10-CM | POA: Diagnosis not present

## 2018-10-04 DIAGNOSIS — I10 Essential (primary) hypertension: Secondary | ICD-10-CM | POA: Diagnosis not present

## 2018-10-04 DIAGNOSIS — Z0000 Encounter for general adult medical examination without abnormal findings: Secondary | ICD-10-CM | POA: Diagnosis not present

## 2018-10-04 DIAGNOSIS — J4520 Mild intermittent asthma, uncomplicated: Secondary | ICD-10-CM | POA: Diagnosis not present

## 2018-10-04 DIAGNOSIS — F411 Generalized anxiety disorder: Secondary | ICD-10-CM | POA: Diagnosis not present

## 2018-10-18 DIAGNOSIS — Z124 Encounter for screening for malignant neoplasm of cervix: Secondary | ICD-10-CM | POA: Diagnosis not present

## 2018-10-18 DIAGNOSIS — Z1231 Encounter for screening mammogram for malignant neoplasm of breast: Secondary | ICD-10-CM | POA: Diagnosis not present

## 2018-10-19 ENCOUNTER — Other Ambulatory Visit: Payer: Self-pay | Admitting: Obstetrics & Gynecology

## 2018-10-19 DIAGNOSIS — E2839 Other primary ovarian failure: Secondary | ICD-10-CM

## 2019-01-12 ENCOUNTER — Other Ambulatory Visit: Payer: BLUE CROSS/BLUE SHIELD

## 2019-03-28 DIAGNOSIS — Z6823 Body mass index (BMI) 23.0-23.9, adult: Secondary | ICD-10-CM | POA: Diagnosis not present

## 2019-03-28 DIAGNOSIS — E7849 Other hyperlipidemia: Secondary | ICD-10-CM | POA: Diagnosis not present

## 2019-03-28 DIAGNOSIS — I10 Essential (primary) hypertension: Secondary | ICD-10-CM | POA: Diagnosis not present

## 2019-03-28 DIAGNOSIS — F411 Generalized anxiety disorder: Secondary | ICD-10-CM | POA: Diagnosis not present

## 2019-03-28 DIAGNOSIS — J4520 Mild intermittent asthma, uncomplicated: Secondary | ICD-10-CM | POA: Diagnosis not present

## 2019-04-23 DIAGNOSIS — M5431 Sciatica, right side: Secondary | ICD-10-CM | POA: Diagnosis not present

## 2019-04-23 DIAGNOSIS — M5136 Other intervertebral disc degeneration, lumbar region: Secondary | ICD-10-CM | POA: Diagnosis not present

## 2019-04-23 DIAGNOSIS — M545 Low back pain: Secondary | ICD-10-CM | POA: Diagnosis not present

## 2019-04-23 DIAGNOSIS — I700 Atherosclerosis of aorta: Secondary | ICD-10-CM | POA: Diagnosis not present

## 2019-04-23 DIAGNOSIS — M47816 Spondylosis without myelopathy or radiculopathy, lumbar region: Secondary | ICD-10-CM | POA: Diagnosis not present

## 2019-04-23 DIAGNOSIS — Z6823 Body mass index (BMI) 23.0-23.9, adult: Secondary | ICD-10-CM | POA: Diagnosis not present

## 2019-05-04 DIAGNOSIS — C441122 Basal cell carcinoma of skin of right lower eyelid, including canthus: Secondary | ICD-10-CM | POA: Diagnosis not present

## 2019-05-04 DIAGNOSIS — H5211 Myopia, right eye: Secondary | ICD-10-CM | POA: Diagnosis not present

## 2019-05-04 DIAGNOSIS — H401431 Capsular glaucoma with pseudoexfoliation of lens, bilateral, mild stage: Secondary | ICD-10-CM | POA: Diagnosis not present

## 2019-05-04 DIAGNOSIS — H2513 Age-related nuclear cataract, bilateral: Secondary | ICD-10-CM | POA: Diagnosis not present

## 2019-06-25 DIAGNOSIS — Z0000 Encounter for general adult medical examination without abnormal findings: Secondary | ICD-10-CM | POA: Diagnosis not present

## 2019-06-25 DIAGNOSIS — I10 Essential (primary) hypertension: Secondary | ICD-10-CM | POA: Diagnosis not present

## 2019-06-25 DIAGNOSIS — J4520 Mild intermittent asthma, uncomplicated: Secondary | ICD-10-CM | POA: Diagnosis not present

## 2019-06-25 DIAGNOSIS — Z6822 Body mass index (BMI) 22.0-22.9, adult: Secondary | ICD-10-CM | POA: Diagnosis not present

## 2019-06-25 DIAGNOSIS — F411 Generalized anxiety disorder: Secondary | ICD-10-CM | POA: Diagnosis not present

## 2019-06-25 DIAGNOSIS — E7849 Other hyperlipidemia: Secondary | ICD-10-CM | POA: Diagnosis not present

## 2019-09-20 DIAGNOSIS — F411 Generalized anxiety disorder: Secondary | ICD-10-CM | POA: Diagnosis not present

## 2019-09-20 DIAGNOSIS — E7849 Other hyperlipidemia: Secondary | ICD-10-CM | POA: Diagnosis not present

## 2019-09-20 DIAGNOSIS — Z6822 Body mass index (BMI) 22.0-22.9, adult: Secondary | ICD-10-CM | POA: Diagnosis not present

## 2019-09-20 DIAGNOSIS — I10 Essential (primary) hypertension: Secondary | ICD-10-CM | POA: Diagnosis not present

## 2019-09-20 DIAGNOSIS — J4520 Mild intermittent asthma, uncomplicated: Secondary | ICD-10-CM | POA: Diagnosis not present

## 2019-11-27 DIAGNOSIS — M85851 Other specified disorders of bone density and structure, right thigh: Secondary | ICD-10-CM | POA: Diagnosis not present

## 2019-11-27 DIAGNOSIS — M85852 Other specified disorders of bone density and structure, left thigh: Secondary | ICD-10-CM | POA: Diagnosis not present

## 2019-11-27 DIAGNOSIS — M810 Age-related osteoporosis without current pathological fracture: Secondary | ICD-10-CM | POA: Diagnosis not present

## 2020-01-09 DIAGNOSIS — E7849 Other hyperlipidemia: Secondary | ICD-10-CM | POA: Diagnosis not present

## 2020-01-09 DIAGNOSIS — Z6822 Body mass index (BMI) 22.0-22.9, adult: Secondary | ICD-10-CM | POA: Diagnosis not present

## 2020-01-09 DIAGNOSIS — M818 Other osteoporosis without current pathological fracture: Secondary | ICD-10-CM | POA: Diagnosis not present

## 2020-01-09 DIAGNOSIS — I10 Essential (primary) hypertension: Secondary | ICD-10-CM | POA: Diagnosis not present

## 2020-01-09 DIAGNOSIS — F411 Generalized anxiety disorder: Secondary | ICD-10-CM | POA: Diagnosis not present

## 2020-01-09 DIAGNOSIS — J4520 Mild intermittent asthma, uncomplicated: Secondary | ICD-10-CM | POA: Diagnosis not present

## 2020-04-08 DIAGNOSIS — Z6822 Body mass index (BMI) 22.0-22.9, adult: Secondary | ICD-10-CM | POA: Diagnosis not present

## 2020-04-08 DIAGNOSIS — Z0000 Encounter for general adult medical examination without abnormal findings: Secondary | ICD-10-CM | POA: Diagnosis not present

## 2020-04-08 DIAGNOSIS — M818 Other osteoporosis without current pathological fracture: Secondary | ICD-10-CM | POA: Diagnosis not present

## 2020-04-08 DIAGNOSIS — E7849 Other hyperlipidemia: Secondary | ICD-10-CM | POA: Diagnosis not present

## 2020-04-08 DIAGNOSIS — F411 Generalized anxiety disorder: Secondary | ICD-10-CM | POA: Diagnosis not present

## 2020-04-08 DIAGNOSIS — J4520 Mild intermittent asthma, uncomplicated: Secondary | ICD-10-CM | POA: Diagnosis not present

## 2020-04-08 DIAGNOSIS — I10 Essential (primary) hypertension: Secondary | ICD-10-CM | POA: Diagnosis not present

## 2020-07-01 DIAGNOSIS — F411 Generalized anxiety disorder: Secondary | ICD-10-CM | POA: Diagnosis not present

## 2020-07-01 DIAGNOSIS — I10 Essential (primary) hypertension: Secondary | ICD-10-CM | POA: Diagnosis not present

## 2020-07-01 DIAGNOSIS — J4520 Mild intermittent asthma, uncomplicated: Secondary | ICD-10-CM | POA: Diagnosis not present

## 2020-07-01 DIAGNOSIS — E7849 Other hyperlipidemia: Secondary | ICD-10-CM | POA: Diagnosis not present

## 2020-07-01 DIAGNOSIS — Z0000 Encounter for general adult medical examination without abnormal findings: Secondary | ICD-10-CM | POA: Diagnosis not present

## 2020-07-01 DIAGNOSIS — Z6822 Body mass index (BMI) 22.0-22.9, adult: Secondary | ICD-10-CM | POA: Diagnosis not present

## 2020-07-01 DIAGNOSIS — M818 Other osteoporosis without current pathological fracture: Secondary | ICD-10-CM | POA: Diagnosis not present

## 2020-09-09 DIAGNOSIS — Z0100 Encounter for examination of eyes and vision without abnormal findings: Secondary | ICD-10-CM | POA: Diagnosis not present

## 2020-09-09 DIAGNOSIS — E7800 Pure hypercholesterolemia, unspecified: Secondary | ICD-10-CM | POA: Diagnosis not present

## 2020-09-09 DIAGNOSIS — H5210 Myopia, unspecified eye: Secondary | ICD-10-CM | POA: Diagnosis not present

## 2020-10-01 DIAGNOSIS — M818 Other osteoporosis without current pathological fracture: Secondary | ICD-10-CM | POA: Diagnosis not present

## 2020-10-01 DIAGNOSIS — Z6823 Body mass index (BMI) 23.0-23.9, adult: Secondary | ICD-10-CM | POA: Diagnosis not present

## 2020-10-01 DIAGNOSIS — J4520 Mild intermittent asthma, uncomplicated: Secondary | ICD-10-CM | POA: Diagnosis not present

## 2020-10-01 DIAGNOSIS — Z0000 Encounter for general adult medical examination without abnormal findings: Secondary | ICD-10-CM | POA: Diagnosis not present

## 2020-10-01 DIAGNOSIS — E7849 Other hyperlipidemia: Secondary | ICD-10-CM | POA: Diagnosis not present

## 2020-10-01 DIAGNOSIS — I10 Essential (primary) hypertension: Secondary | ICD-10-CM | POA: Diagnosis not present

## 2020-10-01 DIAGNOSIS — F411 Generalized anxiety disorder: Secondary | ICD-10-CM | POA: Diagnosis not present

## 2020-11-26 DIAGNOSIS — Z1231 Encounter for screening mammogram for malignant neoplasm of breast: Secondary | ICD-10-CM | POA: Diagnosis not present

## 2020-11-26 DIAGNOSIS — Z124 Encounter for screening for malignant neoplasm of cervix: Secondary | ICD-10-CM | POA: Diagnosis not present

## 2020-11-26 DIAGNOSIS — Z0000 Encounter for general adult medical examination without abnormal findings: Secondary | ICD-10-CM | POA: Diagnosis not present

## 2020-11-26 DIAGNOSIS — M8580 Other specified disorders of bone density and structure, unspecified site: Secondary | ICD-10-CM | POA: Diagnosis not present

## 2020-11-26 DIAGNOSIS — Z01411 Encounter for gynecological examination (general) (routine) with abnormal findings: Secondary | ICD-10-CM | POA: Diagnosis not present

## 2020-11-26 DIAGNOSIS — Z01419 Encounter for gynecological examination (general) (routine) without abnormal findings: Secondary | ICD-10-CM | POA: Diagnosis not present

## 2020-11-26 DIAGNOSIS — Z6823 Body mass index (BMI) 23.0-23.9, adult: Secondary | ICD-10-CM | POA: Diagnosis not present

## 2020-11-26 DIAGNOSIS — R87610 Atypical squamous cells of undetermined significance on cytologic smear of cervix (ASC-US): Secondary | ICD-10-CM | POA: Diagnosis not present

## 2020-12-29 DIAGNOSIS — J329 Chronic sinusitis, unspecified: Secondary | ICD-10-CM | POA: Diagnosis not present

## 2020-12-29 DIAGNOSIS — R059 Cough, unspecified: Secondary | ICD-10-CM | POA: Diagnosis not present

## 2020-12-29 DIAGNOSIS — R52 Pain, unspecified: Secondary | ICD-10-CM | POA: Diagnosis not present

## 2021-04-27 DIAGNOSIS — H401431 Capsular glaucoma with pseudoexfoliation of lens, bilateral, mild stage: Secondary | ICD-10-CM | POA: Diagnosis not present

## 2021-04-27 DIAGNOSIS — H5213 Myopia, bilateral: Secondary | ICD-10-CM | POA: Diagnosis not present

## 2021-05-11 DIAGNOSIS — E7849 Other hyperlipidemia: Secondary | ICD-10-CM | POA: Diagnosis not present

## 2021-05-11 DIAGNOSIS — M818 Other osteoporosis without current pathological fracture: Secondary | ICD-10-CM | POA: Diagnosis not present

## 2021-05-11 DIAGNOSIS — Z131 Encounter for screening for diabetes mellitus: Secondary | ICD-10-CM | POA: Diagnosis not present

## 2021-05-11 DIAGNOSIS — I10 Essential (primary) hypertension: Secondary | ICD-10-CM | POA: Diagnosis not present

## 2021-05-11 DIAGNOSIS — Z0000 Encounter for general adult medical examination without abnormal findings: Secondary | ICD-10-CM | POA: Diagnosis not present

## 2021-05-11 DIAGNOSIS — F411 Generalized anxiety disorder: Secondary | ICD-10-CM | POA: Diagnosis not present

## 2021-05-11 DIAGNOSIS — Z6823 Body mass index (BMI) 23.0-23.9, adult: Secondary | ICD-10-CM | POA: Diagnosis not present

## 2021-06-16 DIAGNOSIS — H401431 Capsular glaucoma with pseudoexfoliation of lens, bilateral, mild stage: Secondary | ICD-10-CM | POA: Diagnosis not present

## 2021-06-16 DIAGNOSIS — H2512 Age-related nuclear cataract, left eye: Secondary | ICD-10-CM | POA: Diagnosis not present

## 2021-07-01 DIAGNOSIS — H269 Unspecified cataract: Secondary | ICD-10-CM | POA: Diagnosis not present

## 2021-07-01 DIAGNOSIS — H2512 Age-related nuclear cataract, left eye: Secondary | ICD-10-CM | POA: Diagnosis not present

## 2021-07-01 DIAGNOSIS — H21562 Pupillary abnormality, left eye: Secondary | ICD-10-CM | POA: Diagnosis not present

## 2021-08-07 DIAGNOSIS — H524 Presbyopia: Secondary | ICD-10-CM | POA: Diagnosis not present

## 2021-09-14 DIAGNOSIS — Z0000 Encounter for general adult medical examination without abnormal findings: Secondary | ICD-10-CM | POA: Diagnosis not present

## 2021-09-14 DIAGNOSIS — Z6823 Body mass index (BMI) 23.0-23.9, adult: Secondary | ICD-10-CM | POA: Diagnosis not present

## 2021-09-14 DIAGNOSIS — M818 Other osteoporosis without current pathological fracture: Secondary | ICD-10-CM | POA: Diagnosis not present

## 2021-09-14 DIAGNOSIS — E7849 Other hyperlipidemia: Secondary | ICD-10-CM | POA: Diagnosis not present

## 2021-09-14 DIAGNOSIS — I10 Essential (primary) hypertension: Secondary | ICD-10-CM | POA: Diagnosis not present

## 2021-09-14 DIAGNOSIS — F411 Generalized anxiety disorder: Secondary | ICD-10-CM | POA: Diagnosis not present

## 2021-12-01 DIAGNOSIS — Z01411 Encounter for gynecological examination (general) (routine) with abnormal findings: Secondary | ICD-10-CM | POA: Diagnosis not present

## 2021-12-01 DIAGNOSIS — R87610 Atypical squamous cells of undetermined significance on cytologic smear of cervix (ASC-US): Secondary | ICD-10-CM | POA: Diagnosis not present

## 2021-12-01 DIAGNOSIS — Z01419 Encounter for gynecological examination (general) (routine) without abnormal findings: Secondary | ICD-10-CM | POA: Diagnosis not present

## 2021-12-01 DIAGNOSIS — Z124 Encounter for screening for malignant neoplasm of cervix: Secondary | ICD-10-CM | POA: Diagnosis not present

## 2021-12-01 DIAGNOSIS — Z1231 Encounter for screening mammogram for malignant neoplasm of breast: Secondary | ICD-10-CM | POA: Diagnosis not present

## 2022-01-11 DIAGNOSIS — K080 Exfoliation of teeth due to systemic causes: Secondary | ICD-10-CM | POA: Diagnosis not present

## 2022-01-14 DIAGNOSIS — F411 Generalized anxiety disorder: Secondary | ICD-10-CM | POA: Diagnosis not present

## 2022-01-14 DIAGNOSIS — I10 Essential (primary) hypertension: Secondary | ICD-10-CM | POA: Diagnosis not present

## 2022-01-14 DIAGNOSIS — Z0000 Encounter for general adult medical examination without abnormal findings: Secondary | ICD-10-CM | POA: Diagnosis not present

## 2022-01-14 DIAGNOSIS — M818 Other osteoporosis without current pathological fracture: Secondary | ICD-10-CM | POA: Diagnosis not present

## 2022-01-14 DIAGNOSIS — E7849 Other hyperlipidemia: Secondary | ICD-10-CM | POA: Diagnosis not present

## 2022-02-01 DIAGNOSIS — H04302 Unspecified dacryocystitis of left lacrimal passage: Secondary | ICD-10-CM | POA: Diagnosis not present

## 2022-04-06 DIAGNOSIS — M810 Age-related osteoporosis without current pathological fracture: Secondary | ICD-10-CM | POA: Diagnosis not present

## 2022-04-08 DIAGNOSIS — H04302 Unspecified dacryocystitis of left lacrimal passage: Secondary | ICD-10-CM | POA: Diagnosis not present

## 2022-05-06 DIAGNOSIS — H119 Unspecified disorder of conjunctiva: Secondary | ICD-10-CM | POA: Diagnosis not present

## 2022-05-06 DIAGNOSIS — H04563 Stenosis of bilateral lacrimal punctum: Secondary | ICD-10-CM | POA: Diagnosis not present

## 2022-05-06 DIAGNOSIS — H0279 Other degenerative disorders of eyelid and periocular area: Secondary | ICD-10-CM | POA: Diagnosis not present

## 2022-05-06 DIAGNOSIS — H04322 Acute dacryocystitis of left lacrimal passage: Secondary | ICD-10-CM | POA: Diagnosis not present

## 2022-05-13 DIAGNOSIS — F411 Generalized anxiety disorder: Secondary | ICD-10-CM | POA: Diagnosis not present

## 2022-05-13 DIAGNOSIS — M818 Other osteoporosis without current pathological fracture: Secondary | ICD-10-CM | POA: Diagnosis not present

## 2022-05-13 DIAGNOSIS — Z0000 Encounter for general adult medical examination without abnormal findings: Secondary | ICD-10-CM | POA: Diagnosis not present

## 2022-05-13 DIAGNOSIS — I10 Essential (primary) hypertension: Secondary | ICD-10-CM | POA: Diagnosis not present

## 2022-05-13 DIAGNOSIS — E7849 Other hyperlipidemia: Secondary | ICD-10-CM | POA: Diagnosis not present

## 2022-06-02 DIAGNOSIS — D3102 Benign neoplasm of left conjunctiva: Secondary | ICD-10-CM | POA: Diagnosis not present

## 2022-06-02 DIAGNOSIS — H11442 Conjunctival cysts, left eye: Secondary | ICD-10-CM | POA: Diagnosis not present

## 2022-06-02 DIAGNOSIS — H119 Unspecified disorder of conjunctiva: Secondary | ICD-10-CM | POA: Diagnosis not present

## 2022-06-17 DIAGNOSIS — H11242 Scarring of conjunctiva, left eye: Secondary | ICD-10-CM | POA: Diagnosis not present

## 2022-06-17 DIAGNOSIS — H119 Unspecified disorder of conjunctiva: Secondary | ICD-10-CM | POA: Diagnosis not present

## 2022-07-19 DIAGNOSIS — K080 Exfoliation of teeth due to systemic causes: Secondary | ICD-10-CM | POA: Diagnosis not present

## 2022-08-31 DIAGNOSIS — M79676 Pain in unspecified toe(s): Secondary | ICD-10-CM | POA: Diagnosis not present

## 2022-08-31 DIAGNOSIS — B351 Tinea unguium: Secondary | ICD-10-CM | POA: Diagnosis not present

## 2022-09-15 DIAGNOSIS — M818 Other osteoporosis without current pathological fracture: Secondary | ICD-10-CM | POA: Diagnosis not present

## 2022-09-15 DIAGNOSIS — E7849 Other hyperlipidemia: Secondary | ICD-10-CM | POA: Diagnosis not present

## 2022-09-15 DIAGNOSIS — Z0000 Encounter for general adult medical examination without abnormal findings: Secondary | ICD-10-CM | POA: Diagnosis not present

## 2022-09-15 DIAGNOSIS — I10 Essential (primary) hypertension: Secondary | ICD-10-CM | POA: Diagnosis not present

## 2022-09-15 DIAGNOSIS — F411 Generalized anxiety disorder: Secondary | ICD-10-CM | POA: Diagnosis not present

## 2023-01-12 ENCOUNTER — Encounter: Payer: Self-pay | Admitting: Internal Medicine

## 2023-01-25 DIAGNOSIS — K080 Exfoliation of teeth due to systemic causes: Secondary | ICD-10-CM | POA: Diagnosis not present

## 2023-01-27 DIAGNOSIS — E7849 Other hyperlipidemia: Secondary | ICD-10-CM | POA: Diagnosis not present

## 2023-01-27 DIAGNOSIS — Z0000 Encounter for general adult medical examination without abnormal findings: Secondary | ICD-10-CM | POA: Diagnosis not present

## 2023-01-27 DIAGNOSIS — M818 Other osteoporosis without current pathological fracture: Secondary | ICD-10-CM | POA: Diagnosis not present

## 2023-01-27 DIAGNOSIS — N1831 Chronic kidney disease, stage 3a: Secondary | ICD-10-CM | POA: Diagnosis not present

## 2023-01-27 DIAGNOSIS — I10 Essential (primary) hypertension: Secondary | ICD-10-CM | POA: Diagnosis not present

## 2023-03-28 DIAGNOSIS — Z6824 Body mass index (BMI) 24.0-24.9, adult: Secondary | ICD-10-CM | POA: Diagnosis not present

## 2023-03-28 DIAGNOSIS — L570 Actinic keratosis: Secondary | ICD-10-CM | POA: Diagnosis not present

## 2023-05-23 DIAGNOSIS — Z0000 Encounter for general adult medical examination without abnormal findings: Secondary | ICD-10-CM | POA: Diagnosis not present

## 2023-05-23 DIAGNOSIS — J440 Chronic obstructive pulmonary disease with acute lower respiratory infection: Secondary | ICD-10-CM | POA: Diagnosis not present

## 2023-05-23 DIAGNOSIS — N1831 Chronic kidney disease, stage 3a: Secondary | ICD-10-CM | POA: Diagnosis not present

## 2023-05-23 DIAGNOSIS — E7849 Other hyperlipidemia: Secondary | ICD-10-CM | POA: Diagnosis not present

## 2023-05-23 DIAGNOSIS — F411 Generalized anxiety disorder: Secondary | ICD-10-CM | POA: Diagnosis not present

## 2023-06-08 DIAGNOSIS — Z87891 Personal history of nicotine dependence: Secondary | ICD-10-CM | POA: Diagnosis not present

## 2023-06-08 DIAGNOSIS — J432 Centrilobular emphysema: Secondary | ICD-10-CM | POA: Diagnosis not present

## 2023-06-08 DIAGNOSIS — I2510 Atherosclerotic heart disease of native coronary artery without angina pectoris: Secondary | ICD-10-CM | POA: Diagnosis not present

## 2023-06-08 DIAGNOSIS — Z122 Encounter for screening for malignant neoplasm of respiratory organs: Secondary | ICD-10-CM | POA: Diagnosis not present

## 2023-06-08 DIAGNOSIS — F17210 Nicotine dependence, cigarettes, uncomplicated: Secondary | ICD-10-CM | POA: Diagnosis not present

## 2023-06-08 DIAGNOSIS — R918 Other nonspecific abnormal finding of lung field: Secondary | ICD-10-CM | POA: Diagnosis not present

## 2023-07-21 DIAGNOSIS — K080 Exfoliation of teeth due to systemic causes: Secondary | ICD-10-CM | POA: Diagnosis not present

## 2023-09-26 DIAGNOSIS — F411 Generalized anxiety disorder: Secondary | ICD-10-CM | POA: Diagnosis not present

## 2023-09-26 DIAGNOSIS — J440 Chronic obstructive pulmonary disease with acute lower respiratory infection: Secondary | ICD-10-CM | POA: Diagnosis not present

## 2023-09-26 DIAGNOSIS — M818 Other osteoporosis without current pathological fracture: Secondary | ICD-10-CM | POA: Diagnosis not present

## 2023-09-26 DIAGNOSIS — N1831 Chronic kidney disease, stage 3a: Secondary | ICD-10-CM | POA: Diagnosis not present
